# Patient Record
Sex: Female | Born: 1960 | Hispanic: Yes | Marital: Married | Smoking: Never smoker
Health system: Southern US, Community
[De-identification: ages and names within clinical notes are randomized; demographics above are authoritative.]

## PROBLEM LIST (undated history)

## (undated) HISTORY — PX: NO PAST SURGERIES: SHX2092

---

## 2006-05-09 ENCOUNTER — Emergency Department: Payer: Self-pay | Admitting: General Practice

## 2013-09-24 ENCOUNTER — Emergency Department: Payer: Self-pay | Admitting: Emergency Medicine

## 2013-10-02 ENCOUNTER — Emergency Department: Payer: Self-pay | Admitting: Emergency Medicine

## 2014-08-16 ENCOUNTER — Emergency Department
Admission: EM | Admit: 2014-08-16 | Discharge: 2014-08-16 | Disposition: A | Discharge status: Home / Self Care | Payer: Self-pay | Attending: Emergency Medicine | Admitting: Emergency Medicine

## 2014-08-16 ENCOUNTER — Encounter: Payer: Self-pay | Admitting: Medical Oncology

## 2014-08-16 DIAGNOSIS — M2550 Pain in unspecified joint: Secondary | ICD-10-CM

## 2014-08-16 DIAGNOSIS — M79641 Pain in right hand: Secondary | ICD-10-CM | POA: Insufficient documentation

## 2014-08-16 DIAGNOSIS — M7989 Other specified soft tissue disorders: Secondary | ICD-10-CM | POA: Insufficient documentation

## 2014-08-16 DIAGNOSIS — M79642 Pain in left hand: Secondary | ICD-10-CM | POA: Insufficient documentation

## 2014-08-16 DIAGNOSIS — R21 Rash and other nonspecific skin eruption: Secondary | ICD-10-CM | POA: Insufficient documentation

## 2014-08-16 LAB — CBC WITH DIFFERENTIAL/PLATELET
Basophils Absolute: 0 10*3/uL (ref 0–0.1)
Basophils Relative: 1 %
EOS PCT: 11 %
Eosinophils Absolute: 0.6 10*3/uL (ref 0–0.7)
HEMATOCRIT: 41.8 % (ref 35.0–47.0)
HEMOGLOBIN: 14.2 g/dL (ref 12.0–16.0)
LYMPHS PCT: 20 %
Lymphs Abs: 1.2 10*3/uL (ref 1.0–3.6)
MCH: 31.1 pg (ref 26.0–34.0)
MCHC: 33.8 g/dL (ref 32.0–36.0)
MCV: 91.9 fL (ref 80.0–100.0)
MONO ABS: 0.2 10*3/uL (ref 0.2–0.9)
Monocytes Relative: 3 %
Neutro Abs: 3.9 10*3/uL (ref 1.4–6.5)
Neutrophils Relative %: 65 %
Platelets: 161 10*3/uL (ref 150–440)
RBC: 4.55 MIL/uL (ref 3.80–5.20)
RDW: 13 % (ref 11.5–14.5)
WBC: 5.9 10*3/uL (ref 3.6–11.0)

## 2014-08-16 LAB — BASIC METABOLIC PANEL
Anion gap: 11 (ref 5–15)
BUN: 11 mg/dL (ref 6–20)
CHLORIDE: 100 mmol/L — AB (ref 101–111)
CO2: 24 mmol/L (ref 22–32)
Calcium: 8.9 mg/dL (ref 8.9–10.3)
Creatinine, Ser: 0.67 mg/dL (ref 0.44–1.00)
GFR calc non Af Amer: >60 mL/min (ref >60)
GLUCOSE: 118 mg/dL — AB (ref 65–99)
POTASSIUM: 3.8 mmol/L (ref 3.5–5.1)
Sodium: 135 mmol/L (ref 135–145)

## 2014-08-16 LAB — SEDIMENTATION RATE: Sed Rate: 36 mm/hr — ABNORMAL HIGH (ref 0–30)

## 2014-08-16 MED ORDER — PREDNISONE 10 MG PO TABS: ORAL_TABLET | ORAL | Status: AC

## 2014-08-16 MED ORDER — IBUPROFEN 600 MG PO TABS
600.0000 mg | ORAL_TABLET | Freq: Once | ORAL | Status: AC
  Administered 2014-08-16: 600 mg via ORAL
  Filled 2014-08-16: qty 1

## 2014-08-16 NOTE — ED Provider Notes (Signed)
[facility] Emergency Department Provider Note  ____________________________________________  Time seen: Approximately 9:58 AM  I have reviewed the triage vital signs and the nursing notes.   HISTORY  Chief Complaint Rash and Joint Pain  History was obtained by the Spanish interpreter  HPI Sherri Cunningham is a 54 y.o. female comes in to the complaint of rash to arms and legs for 10 days. Patient states that this rash does not itch and there is no pain. Several days ago she began noticing that she had joint pain in both her hands. She has never experienced this before and has not been taking any over-the-counter medication. She denies the use of any new medications, foods, clothing or soaps. She does not have a PCP at this time. She denies any previous arthritis problems and is unaware of any family history.Currently her pain is 10 out of 10   History reviewed. No pertinent past medical history.  There are no active problems to display for this patient.   History reviewed. No pertinent past surgical history.  Current Outpatient Rx  Name  Route  Sig  Dispense  Refill  . predniSONE (DELTASONE) 10 MG tablet      Take 6 tablets  today, on day 2 take 5 tablets, day 3 take 4 tablets, day 4 take 3 tablets, day 5 take  2 tablets and 1 tablet the last day   21 tablet   0     Allergies Review of patient's allergies indicates no known allergies.  No family history on file.  Social History Social History  Substance Use Topics  . Smoking status: Never Smoker   . Smokeless tobacco: None  . Alcohol Use: No    Review of Systems Constitutional: No fever/chills ENT: No sore throat. Cardiovascular: Denies chest pain. Respiratory: Denies shortness of breath. Gastrointestinal: No abdominal pain.  No nausea, no vomiting.  No diarrhea.  No constipation. Genitourinary: Negative for dysuria. Musculoskeletal: Negative for back pain. Skin: Positive for  rash. Neurological: Negative for headaches, focal weakness or numbness.  10-point ROS otherwise negative.  ____________________________________________   PHYSICAL EXAM:  VITAL SIGNS: ED Triage Vitals  Enc Vitals Group     BP 08/16/14 0923 114/72 mmHg     Pulse Rate 08/16/14 0923 86     Resp 08/16/14 0923 18     Temp 08/16/14 0923 98.8 F (37.1 C)     Temp Source 08/16/14 0923 Oral     SpO2 08/16/14 0923 98 %     Weight 08/16/14 0923 154 lb (69.854 kg)     Height --      Head Cir --      Peak Flow --      Pain Score 08/16/14 0924 10     Pain Loc --      Pain Edu? --      Excl. in GC? --     Constitutional: Alert and oriented. Well appearing and in no acute distress. Eyes: Conjunctivae are normal. PERRL. EOMI. Head: Atraumatic. Nose: No congestion/rhinnorhea. Neck: No stridor.  No cervical tenderness on palpation Hematological/Lymphatic/Immunilogical: No cervical lymphadenopathy. Cardiovascular: Normal rate, regular rhythm. Grossly normal heart sounds.  Good peripheral circulation. Respiratory: Normal respiratory effort.  No retractions. Lungs CTAB. Gastrointestinal: Soft and nontender. No distention. Musculoskeletal: Bilateral hands appear to be slightly swollen including fingers. Range of motion is restricted secondary to pain. Patient is unable to grip due to pain and swelling. There is no warmth or redness. No other joints  at this time are affected. No lower extremity tenderness nor edema.  No joint effusions.  Neurologic:  Normal speech and language. No gross focal neurologic deficits are appreciated. No gait instability. Skin:  Skin is warm, dry and intact. There is a diffuse macular rash noted on the upper and lower extremities. This does not involve the trunk area. Areas are not warm to touch. There is no tenderness on palpation of the rash areas. Psychiatric: Mood and affect are normal. Speech and behavior are normal.  ____________________________________________    LABS (all labs ordered are listed, but only abnormal results are displayed)  Labs Reviewed  BASIC METABOLIC PANEL - Abnormal; Notable for the following:    Chloride 100 (*)    Glucose, Bld 118 (*)    All other components within normal limits  SEDIMENTATION RATE - Abnormal; Notable for the following:    Sed Rate 36 (*)    All other components within normal limits  CBC WITH DIFFERENTIAL/PLATELET    PROCEDURES  Procedure(s) performed: None  Critical Care performed: No  ____________________________________________   INITIAL IMPRESSION / ASSESSMENT AND PLAN / ED COURSE  Pertinent labs & imaging results that were available during my care of the patient were reviewed by me and considered in my medical decision making (see chart for details).  Patient was told by interpreter that this most likely could be a connective tissue disease. Patient was started on prednisone for 6 day taper. She was given the name and phone number for Dr. Lavenia Atlas to make an appointment. ____________________________________________   FINAL CLINICAL IMPRESSION(S) / ED DIAGNOSES  Final diagnoses:  Arthralgia      Tommi Rumps, PA-C 08/16/14 1614  Emily Filbert, MD 08/17/14 [phone]

## 2014-08-16 NOTE — ED Notes (Signed)
Developed itching to both arms about 10 days ago  Now itching to legs. No resp distress noted at present

## 2014-08-16 NOTE — ED Notes (Signed)
Pt ambulatory to triage with reports of rash to arms and legs x 10 days, a few days ago pt began having joint aching also.

## 2014-08-16 NOTE — ED Notes (Signed)
Per husband she is having pain to joints.

## 2014-08-16 NOTE — Discharge Instructions (Signed)
Artralgia  (Arthralgia)  La artralgia es el dolor en las articulaciones. La articulacin es el lugar en que se unen dos huesos. El dolor articular puede ocurrir por diversos motivos. La articulacin puede tener un hematoma, una infeccin, volverse rgida o debilitarse por la edad. El dolor generalmente desaparece despus de hacer reposo y tomar medicamentos para calmar el dolor.  CUIDADOS EN EL HOGAR   Haga descansar la articulacin tal como le indic el mdico.  Mantenga arriba (elevada) la articulacin dolorida durante las primeras 24 horas.  Aplique hielo sobre la zona.  Ponga el hielo en una bolsa plstica.  Colquese una toalla entre la piel y la bolsa de hielo.  Deje el hielo durante 15 a 20 minutos, 3 a 4 veces por da.  Use un cabestrillo, un yeso o venda elstica segn las indicaciones.  Tome los medicamentos segn le indique el mdico. No tome aspirina.  Utilice las muletas como le haya indicado el mdico. No aplique el peso sobre la articulacin hasta que el mdico lo autorice. SOLICITE AYUDA DE INMEDIATO SI:   Tiene un hematoma, inflamacin (hinchazn) o aumenta el dolor.  Los dedos de las manos o de los pies estn azules o comienza a perder la sensibilidad (adormecimiento).  Los medicamentos no le calman el dolor.  El dolor se hace ms intenso.  La temperatura oral le sube a ms de 38,9 C (102 F), y no puede bajarla con medicamentos.  No puede mover o usar la articulacin. ASEGRESE DE QUE:   Comprende estas instrucciones.  Controlar su enfermedad.  Solicitar ayuda de inmediato si no mejora o si empeora. Document Released: 12/12/2010 Document Revised: 03/17/2011 Exit[employer] Patient Information 2015 [employer]. This information is not intended to replace advice given to you by your health care provider. Make sure you discuss any questions you have with your health care provider.  

## 2017-10-12 ENCOUNTER — Ambulatory Visit: Payer: Self-pay | Attending: Oncology

## 2018-02-03 ENCOUNTER — Encounter (INDEPENDENT_AMBULATORY_CARE_PROVIDER_SITE_OTHER): Payer: Self-pay

## 2018-02-03 ENCOUNTER — Ambulatory Visit: Payer: Self-pay | Attending: Oncology

## 2018-02-03 ENCOUNTER — Ambulatory Visit
Admission: RE | Admit: 2018-02-03 | Discharge: 2018-02-03 | Disposition: A | Discharge status: Home / Self Care | Payer: Self-pay | Source: Ambulatory Visit | Attending: Oncology | Admitting: Oncology

## 2018-02-03 VITALS — BP 118/76 | HR 91 | Temp 97.7°F | Ht 58.25 in | Wt 154.6 lb

## 2018-02-03 DIAGNOSIS — Z0000 Encounter for general adult medical examination without abnormal findings: Secondary | ICD-10-CM | POA: Insufficient documentation

## 2018-02-03 NOTE — Progress Notes (Signed)
  Subjective:     Patient ID: Sherri Cunningham, female   DOB: [birth year], 58 y.o.   MRN: [id number]  HPI   Review of Systems     Objective:   Physical Exam Chest:     Breasts:        Right: No swelling, bleeding, inverted nipple, mass, nipple discharge, skin change or tenderness.        Left: No swelling, bleeding, inverted nipple, mass, nipple discharge, skin change or tenderness.        Assessment:     58 year old patient presents for [facility] clinic visit. Patient one hour late for appointment, confused and went to ED instead.   Last mammogram through [facility] in 2005.  [name] interpreted exam. Patient screened, and meets BCCCP eligibility.  Patient does not have insurance, Medicare or Medicaid.  Handout given on Affordable Care Act.  Instructed patient on breast self awareness using teach back method.  Clinical breast exam unremarkable.  No mass or lump palpated.    Risk Assessment    Risk Scores      02/03/2018   Last edited by: Alta Corning, CMA   5-year risk: 0.7 %   Lifetime risk: 4.7 %             Plan:     Sent for bilateral screening mammogram.  Encouraged annual mammograms.  Pap smear due in July 2020.  Patient opts for pap at next annual visit.

## 2018-02-04 ENCOUNTER — Other Ambulatory Visit: Payer: Self-pay

## 2018-02-04 DIAGNOSIS — N63 Unspecified lump in unspecified breast: Secondary | ICD-10-CM

## 2018-02-12 ENCOUNTER — Ambulatory Visit
Admission: RE | Admit: 2018-02-12 | Discharge: 2018-02-12 | Disposition: A | Discharge status: Home / Self Care | Payer: Self-pay | Source: Ambulatory Visit | Attending: Oncology | Admitting: Oncology

## 2018-02-12 DIAGNOSIS — N63 Unspecified lump in unspecified breast: Secondary | ICD-10-CM | POA: Insufficient documentation

## 2018-02-22 ENCOUNTER — Other Ambulatory Visit: Payer: Self-pay

## 2018-02-22 DIAGNOSIS — N63 Unspecified lump in unspecified breast: Secondary | ICD-10-CM

## 2018-02-22 NOTE — Progress Notes (Signed)
Patient scheduled for 6 month follow-up left breast mammogram and ultrasound 08/17/2018 at 10:00.  Mailed appointment information.  Copy to [facility].

## 2018-08-17 ENCOUNTER — Ambulatory Visit
Admission: RE | Admit: 2018-08-17 | Discharge: 2018-08-17 | Disposition: A | Discharge status: Home / Self Care | Payer: Self-pay | Source: Ambulatory Visit | Attending: Oncology | Admitting: Oncology

## 2018-08-17 DIAGNOSIS — N63 Unspecified lump in unspecified breast: Secondary | ICD-10-CM | POA: Insufficient documentation

## 2018-08-19 NOTE — Progress Notes (Signed)
Radiologist discussed Birads 3 findings with patient. Patient to return in six months for annual screening and six month follow-up bilateral  mammogram and targeted left breast ultrasound.  Copy to [facility].  Mailed appointment information to patient.

## 2019-02-16 ENCOUNTER — Encounter (INDEPENDENT_AMBULATORY_CARE_PROVIDER_SITE_OTHER): Payer: Self-pay

## 2019-02-16 ENCOUNTER — Ambulatory Visit: Payer: Self-pay | Attending: Oncology

## 2019-02-16 ENCOUNTER — Ambulatory Visit
Admission: RE | Admit: 2019-02-16 | Discharge: 2019-02-16 | Disposition: A | Discharge status: Home / Self Care | Payer: Self-pay | Source: Ambulatory Visit | Attending: Oncology | Admitting: Oncology

## 2019-02-16 ENCOUNTER — Other Ambulatory Visit: Payer: Self-pay

## 2019-02-16 VITALS — BP 118/83 | HR 60 | Temp 98.0°F | Resp 18 | Ht 59.0 in | Wt 160.0 lb

## 2019-02-16 DIAGNOSIS — N63 Unspecified lump in unspecified breast: Secondary | ICD-10-CM

## 2019-02-16 DIAGNOSIS — Z0000 Encounter for general adult medical examination without abnormal findings: Secondary | ICD-10-CM

## 2019-02-16 NOTE — Progress Notes (Signed)
  Subjective:     Patient ID: Sherri Cunningham, female   DOB: September 30, 1960, 59 y.o.   MRN: [id number]  HPI   Review of Systems     Objective:   Physical Exam Chest:     Breasts:        Right: No swelling, bleeding, inverted nipple, mass, nipple discharge, skin change or tenderness.        Left: No swelling, bleeding, inverted nipple, mass, nipple discharge, skin change or tenderness.  Genitourinary:    Labia:        Right: No rash, tenderness, lesion or injury.        Left: No rash, tenderness, lesion or injury.      Vagina: No signs of injury and foreign body. No vaginal discharge, erythema, tenderness, bleeding, lesions or prolapsed vaginal walls.     Cervix: No cervical motion tenderness, discharge, friability, lesion, erythema, cervical bleeding or eversion.     Uterus: Not deviated, not enlarged, not fixed, not tender and no uterine prolapse.      Adnexa:        Right: No mass, tenderness or fullness.         Left: No mass, tenderness or fullness.          Assessment:     59 year old patient returns for [facility] clinic visit, and 6 month follow-up left breast mass per radiology recommendation.  Sherri Cunningham interpreted exam. Patient screened, and meets BCCCP eligibility.  Patient does not have insurance, Medicare or Medicaid. Instructed patient on breast self awareness using teach back method.  Clinical breast exam unremarkable. No mass or lump palpated.  Pelvic exam normal.      Plan:     Sent for bilateral screening mammogram. Specimen collected for pap.

## 2019-02-17 NOTE — Progress Notes (Signed)
Letter mailed from [facility] to notify of normal mammogram results.  Pap results pending. Letter mailed normal pap results.  To return for annual BCCCP screening. Copy to [place].

## 2019-02-20 LAB — IGP, APTIMA HPV: HPV Aptima: NEGATIVE

## 2019-05-23 ENCOUNTER — Ambulatory Visit
Admission: EM | Admit: 2019-05-23 | Discharge: 2019-05-23 | Disposition: A | Discharge status: Home / Self Care | Payer: Self-pay | Attending: Emergency Medicine | Admitting: Emergency Medicine

## 2019-05-23 ENCOUNTER — Other Ambulatory Visit: Payer: Self-pay

## 2019-05-23 DIAGNOSIS — L299 Pruritus, unspecified: Secondary | ICD-10-CM

## 2019-05-23 MED ORDER — HYDROXYZINE HCL 25 MG PO TABS
25.0000 mg | ORAL_TABLET | Freq: Four times a day (QID) | ORAL | 0 refills | Status: AC | PRN
Start: 2019-05-23 — End: ?

## 2019-05-23 MED ORDER — HYDROXYZINE HCL 25 MG PO TABS
25.0000 mg | ORAL_TABLET | Freq: Four times a day (QID) | ORAL | 0 refills | Status: DC | PRN
Start: 2019-05-23 — End: 2019-05-23

## 2019-05-23 MED ORDER — PREDNISONE 10 MG (21) PO TBPK
ORAL_TABLET | ORAL | 0 refills | Status: AC
Start: 2019-05-23 — End: ?

## 2019-05-23 NOTE — Discharge Instructions (Addendum)
Restart Selsun Blue or another medicated shampoo that has worked for you in the past, 6-day prednisone taper, discontinue Benadryl. start Claritin or Zyrtec, and if the Claritin or Zyrtec does not work, stop the Claritin or Zyrtec and start the Atarax.  Follow-up with dermatology.  Would also suggest stop coloring your hair.

## 2019-05-23 NOTE — ED Triage Notes (Addendum)
Patient complains of rash only in her scalp. Patient states that he has a bump like area and it leaks fluid and spreads to other part of her scalp. Patient states that this has been an ongoing issue x 2 years. Patient states that she has been trying multiple different shampoos.  Patient son is interpreting for patient. Patient prefers this over interpreter service.

## 2019-05-23 NOTE — ED Provider Notes (Signed)
HPI  SUBJECTIVE:  Sherri Cunningham is a 59 y.o. female who presents with a scalp rash for the past 2 years.  She reports itchy "bumps" that she then scratches.  She reports pain where the bumps formed.  She denies purulent drainage.  She states that this is not completely gone away over the past 2 years.  She states symptoms started after she began coloring her hair.  She states that she tried a Glass blower/designer free dye" which did not irritate her scalp.  No fevers.  The rash is not spreading.  She denies rash elsewhere.  She has tried Benadryl, cortisone OTC cream, head and shoulders and Selsun Blue shampoo.  The Benadryl, shampoos help.  Symptoms are worse when she colors her hair.  She does not take any medications on a regular basis.  She has a history of seasonal allergies.  No food allergies, asthma, eczema, diabetes, hypertension.  PMD: None.  All history obtained through son acting as interpreter.  They preferred to use a family member as an interpreter rather than the language line.  History reviewed. No pertinent past medical history.  Past Surgical History:  Procedure Laterality Date  . NO PAST SURGERIES      History reviewed. No pertinent family history.  Social History   Tobacco Use  . Smoking status: Never Smoker  . Smokeless tobacco: Never Used  Substance Use Topics  . Alcohol use: Yes    Comment: rare  . Drug use: Never    No current facility-administered medications for this encounter.  Current Outpatient Medications:  .  hydrOXYzine (ATARAX/VISTARIL) 25 MG tablet, Take 1 tablet (25 mg total) by mouth every 6 (six) hours as needed for itching., Disp: 30 tablet, Rfl: 0 .  predniSONE (STERAPRED UNI-PAK 21 TAB) 10 MG (21) TBPK tablet, Dispense one 6 day pack. Take as directed with food., Disp: 21 tablet, Rfl: 0  No Known Allergies   ROS  As noted in HPI.   Physical Exam  BP (!) 128/95 (BP Location: Left Arm)   Pulse 60   Temp 97.9 F (36.6 C) (Oral)    Resp 18   Ht 5' (1.524 m)   Wt 69.9 kg   SpO2 99%   BMI 30.08 kg/m   Constitutional: Well developed, well nourished, no acute distress Eyes:  EOMI, conjunctiva normal bilaterally HENT: Normocephalic, atraumatic,mucus membranes moist Respiratory: Normal inspiratory effort Cardiovascular: Normal rate GI: nondistended Skin: No appreciable papules, pustules on the scalp.  Positive several scars and excoriations without crusting.  Occasional dry skin. Musculoskeletal: no deformities Neurologic: Alert & oriented x 3, no focal neuro deficits Psychiatric: Speech and behavior appropriate   ED Course   Medications - No data to display  No orders of the defined types were placed in this encounter.   No results found for this or any previous visit (from the past 24 hour(s)). No results found.  ED Clinical Impression  1. Scalp pruritus      ED Assessment/Plan  Symptoms started after she started dying her hair.  I suspect that this is aggravating her symptoms.  Could also be a food allergy.  Seborrheic dermatitis in the differential.  Does not appear to be a bacterial or fungal infection. will try her on Selsun Blue, 6-day prednisone taper, discontinue Benadryl, Claritin or Zyrtec, and if the Claritin or Zyrtec does not work, she is to discontinue the Claritin/Zyrtec and start Atarax.  Follow-up with dermatology.  We will also provide primary care list for  ongoing care.  Discussed  MDM, treatment plan, and plan for follow-up with patient and son.  They agree  with plan.   Meds ordered this encounter  Medications  . predniSONE (STERAPRED UNI-PAK 21 TAB) 10 MG (21) TBPK tablet    Sig: Dispense one 6 day pack. Take as directed with food.    Dispense:  21 tablet    Refill:  0  . DISCONTD: hydrOXYzine (ATARAX/VISTARIL) 25 MG tablet    Sig: Take 1 tablet (25 mg total) by mouth every 6 (six) hours as needed for itching.    Dispense:  20 tablet    Refill:  0  . hydrOXYzine  (ATARAX/VISTARIL) 25 MG tablet    Sig: Take 1 tablet (25 mg total) by mouth every 6 (six) hours as needed for itching.    Dispense:  30 tablet    Refill:  0    *This clinic note was created using Scientist, clinical (histocompatibility and immunogenetics). Therefore, there may be occasional mistakes despite careful proofreading.   ?    Domenick Gong, MD 05/25/19 [phone]

## 2021-02-09 IMAGING — MG DIGITAL DIAGNOSTIC BILAT W/ TOMO W/ CAD
8 series · 8 of 24 positions shown · non-contrast
Comparison: Previous exam(s).

CLINICAL DATA: 58-year-old patient presents for 1 year follow-up of
a probably benign left breast mass and annual bilateral mammogram.
Spot Spanish interpreter present during the exam.

EXAM:
DIGITAL DIAGNOSTIC BILATERAL MAMMOGRAM WITH CAD AND TOMO
ULTRASOUND LEFT BREAST

[L CC synth-2D]
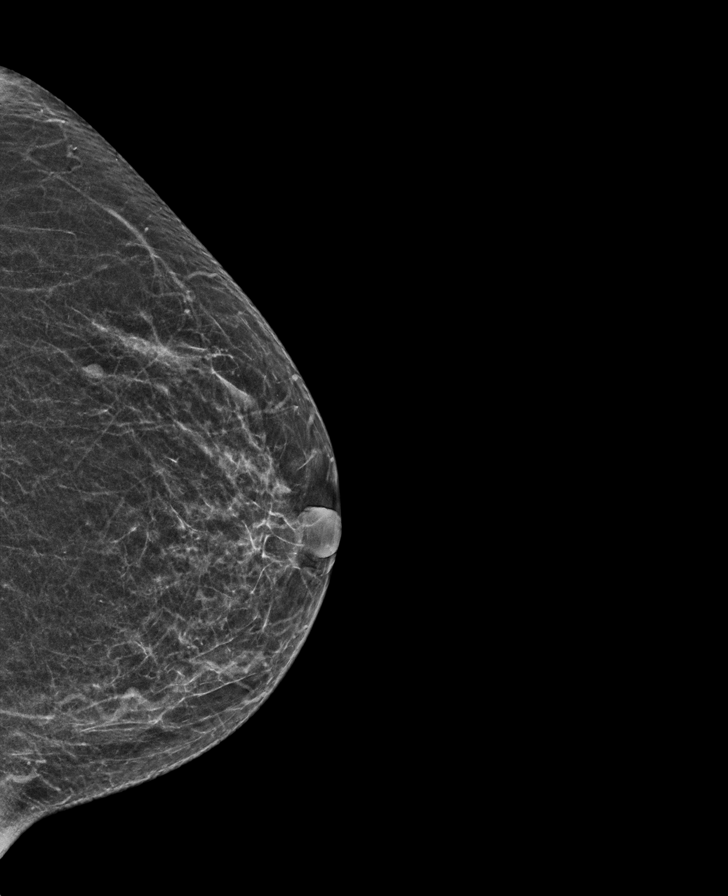

[R MLO synth-2D]
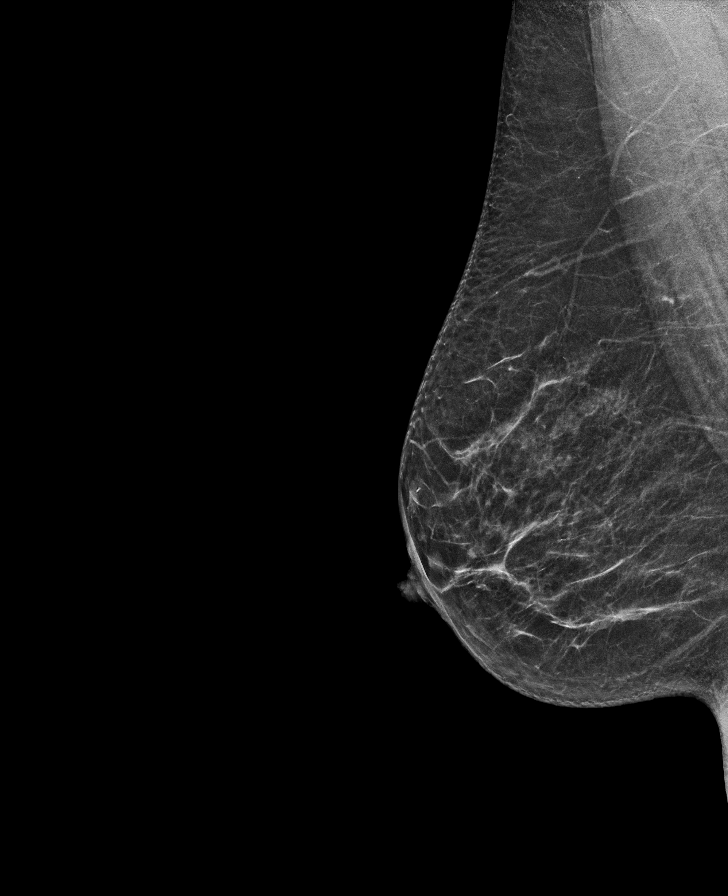

[R CC synth-2D]
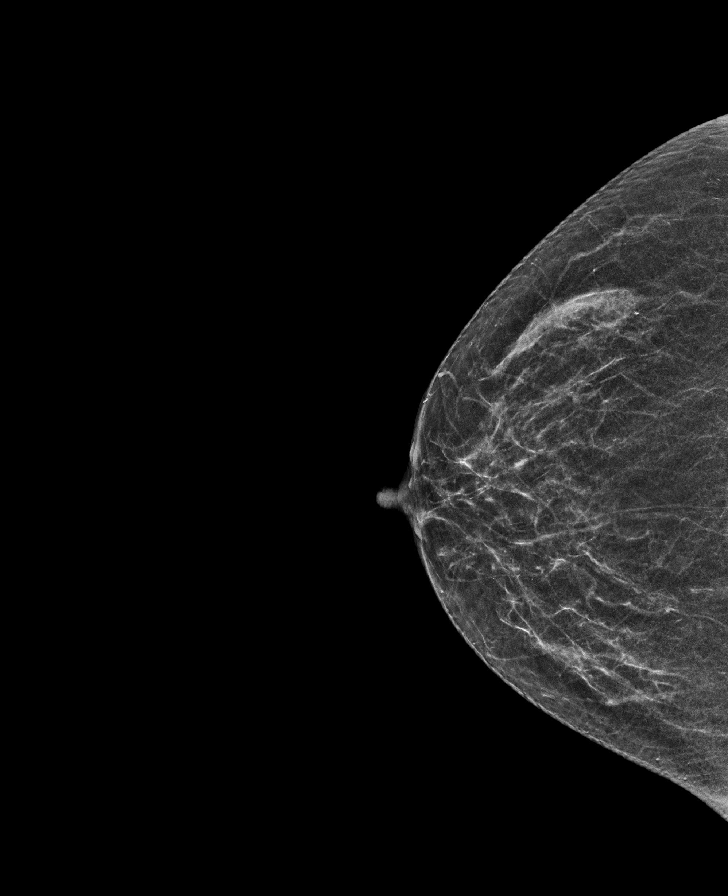

[L MLO synth-2D]
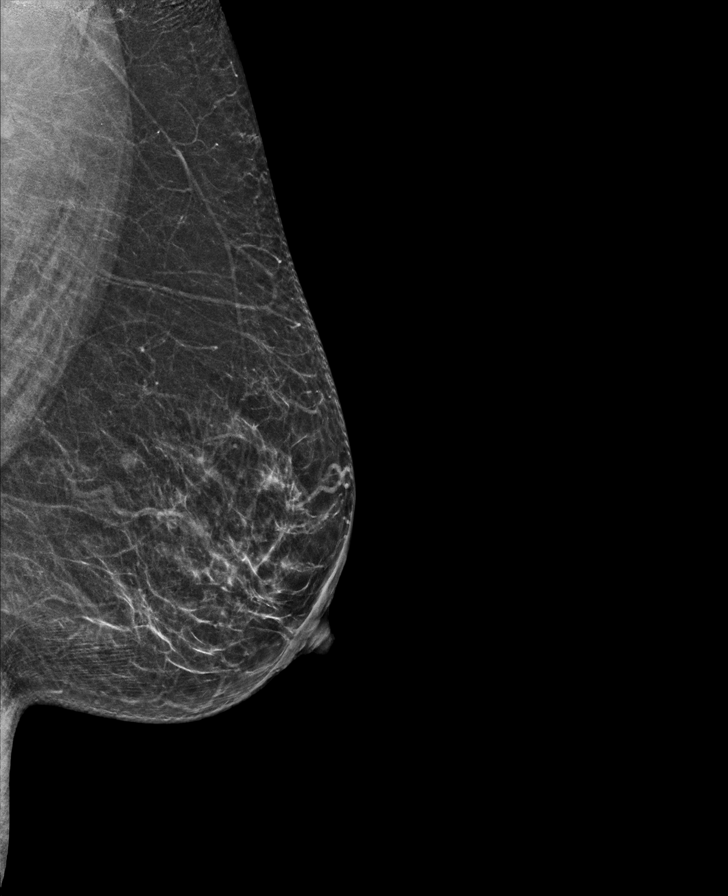

[L CC tomo · tomo slice 25/49.0]
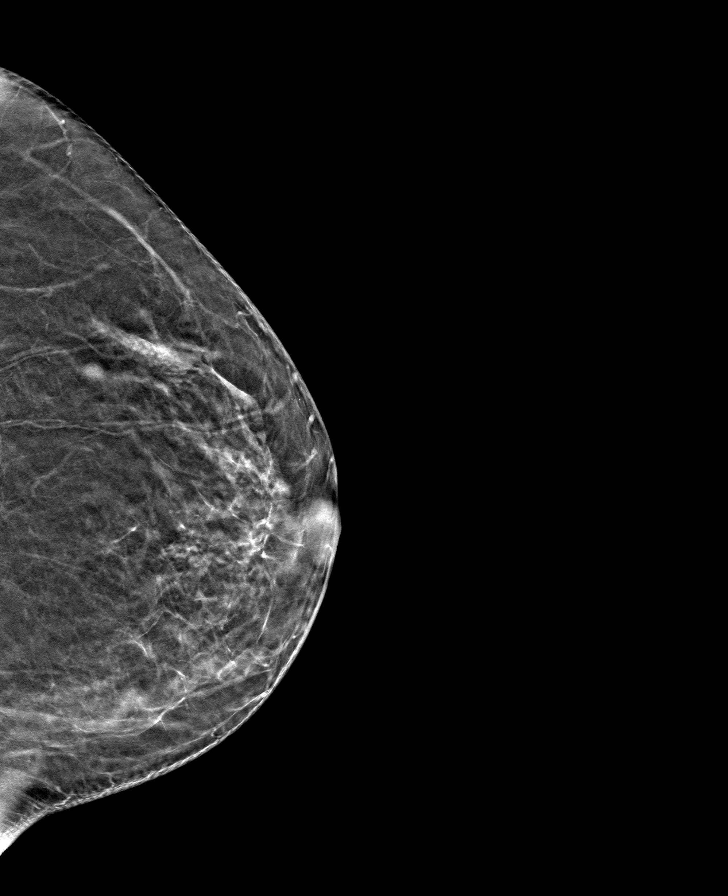

[R CC tomo · tomo slice 26/51.0]
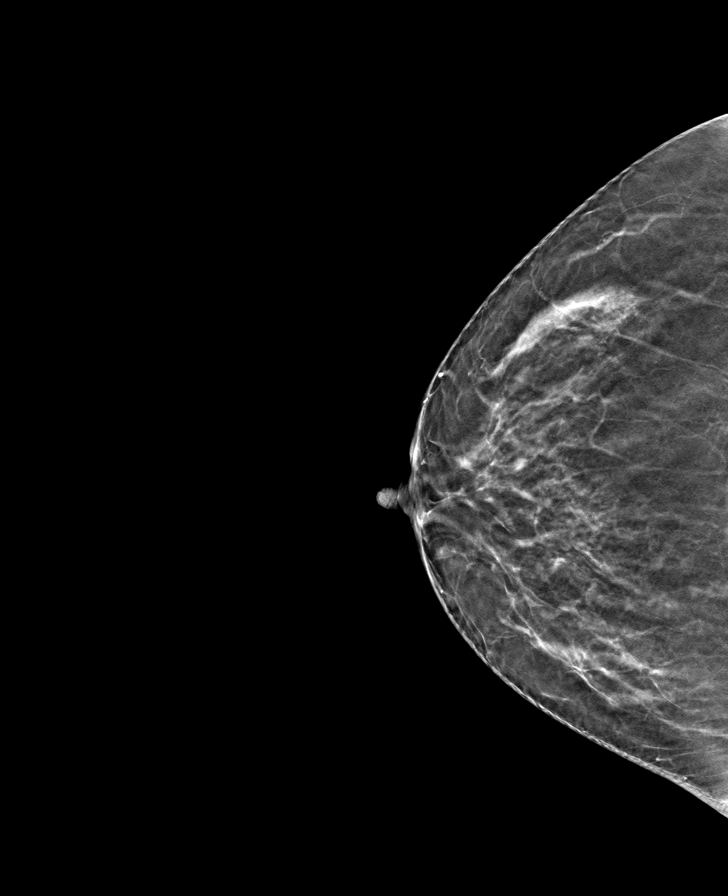

[L MLO tomo · tomo slice 29/57.0]
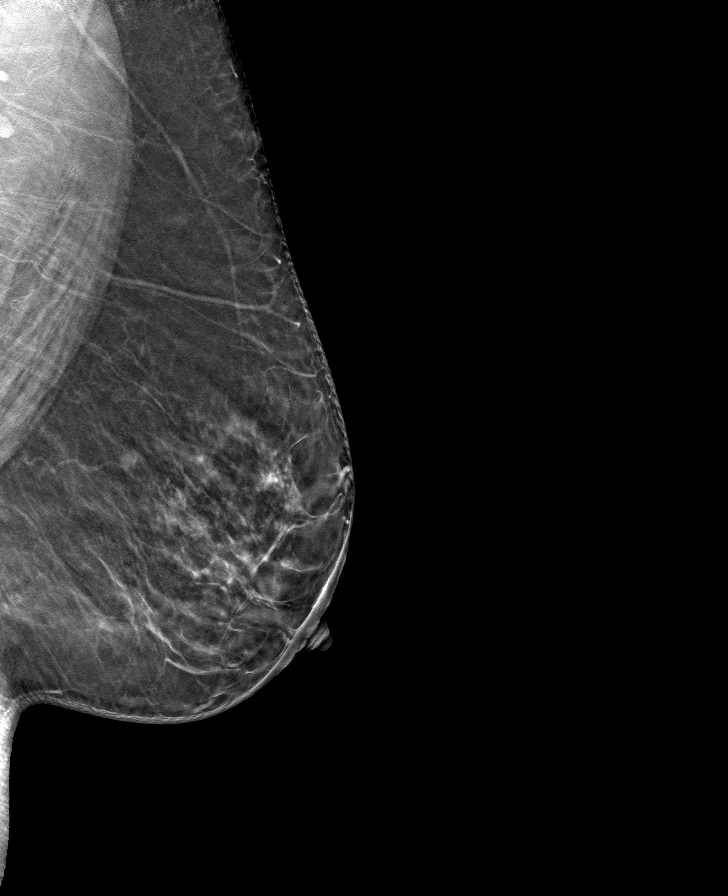

[R MLO tomo · tomo slice 27/54.0]
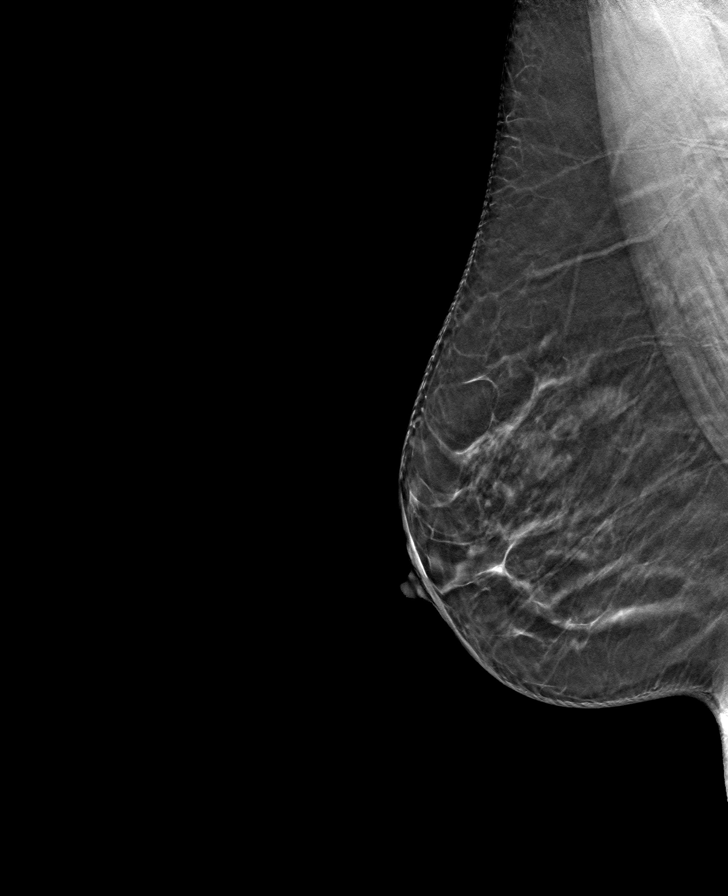

[8 of 24 positions shown; findings below may reference images not displayed]

ACR Breast Density Category b: There are scattered areas of
fibroglandular density.
FINDINGS: Stable 5 mm circumscribed oval mass in the outer left breast. No new
or suspicious mass, architectural distortion, or suspicious
microcalcification is identified in either breast.

Mammographic images were processed with CAD.

Targeted ultrasound is performed, showing a circumscribed oval
parallel cystic mass with some internal debris at 3 o'clock position
5 cm from nipple. No associated vascular flow. Posterior acoustic
enhancement present. Mass measures 5 x 4 x 5 mm. In February 2018,
this measured 6 x 4 x 5 mm.
IMPRESSION: Stable benign-appearing circumscribed mass in the 3 o'clock position
of the left breast. Imaging features suggest a mildly complicated
cyst.

RECOMMENDATION:
Screening mammogram in one year.(Code:DS-4-JH6)

I have discussed the findings and recommendations with the patient.
If applicable, a reminder letter will be sent to the patient
regarding the next appointment.

BI-RADS CATEGORY  2: Benign.

## 2023-11-23 ENCOUNTER — Encounter: Payer: Self-pay | Admitting: Nurse Practitioner
